# Patient Record
Sex: Male | Born: 1990 | Race: Black or African American | Hispanic: No | Smoking: Current every day smoker
Health system: Southern US, Community
[De-identification: ages and names within clinical notes are randomized; demographics above are authoritative.]

---

## 2009-08-03 ENCOUNTER — Ambulatory Visit: Payer: Self-pay | Admitting: Family Medicine

## 2009-08-03 DIAGNOSIS — G43009 Migraine without aura, not intractable, without status migrainosus: Secondary | ICD-10-CM | POA: Insufficient documentation

## 2009-08-03 DIAGNOSIS — L910 Hypertrophic scar: Secondary | ICD-10-CM | POA: Insufficient documentation

## 2009-10-03 ENCOUNTER — Ambulatory Visit: Payer: Self-pay | Admitting: Family Medicine

## 2011-12-05 ENCOUNTER — Encounter: Payer: Self-pay | Admitting: *Deleted

## 2011-12-05 ENCOUNTER — Emergency Department (HOSPITAL_BASED_OUTPATIENT_CLINIC_OR_DEPARTMENT_OTHER)
Admission: EM | Admit: 2011-12-05 | Discharge: 2011-12-06 | Disposition: A | Discharge status: Home / Self Care | Payer: Self-pay | Attending: Emergency Medicine | Admitting: Emergency Medicine

## 2011-12-05 DIAGNOSIS — J111 Influenza due to unidentified influenza virus with other respiratory manifestations: Secondary | ICD-10-CM | POA: Insufficient documentation

## 2011-12-05 MED ORDER — ACETAMINOPHEN 500 MG PO TABS
1000.0000 mg | ORAL_TABLET | Freq: Once | ORAL | Status: AC
  Administered 2011-12-05: 1000 mg via ORAL
  Filled 2011-12-05: qty 2

## 2011-12-05 NOTE — ED Notes (Signed)
Pt c/o flu like symptoms  x 2 days 

## 2011-12-06 MED ORDER — ALBUTEROL SULFATE HFA 108 (90 BASE) MCG/ACT IN AERS
1.0000 | INHALATION_SPRAY | RESPIRATORY_TRACT | Status: DC | PRN
  Administered 2011-12-06: 2 via RESPIRATORY_TRACT
  Filled 2011-12-06: qty 6.7

## 2011-12-06 MED ORDER — HYDROCOD POLST-CHLORPHEN POLST 10-8 MG/5ML PO LQCR: 5.0000 mL | Freq: Two times a day (BID) | ORAL | Status: AC | PRN

## 2011-12-06 NOTE — ED Provider Notes (Signed)
History     CSN: [id number] Arrival date & time: 12/05/2011 11:53 PM   First MD Initiated Contact with Patient 12/06/11 0000      Chief Complaint  Patient presents with  . Influenza    (Consider location/radiation/quality/duration/timing/severity/associated sxs/prior treatment) Patient is a 20 y.o. male presenting with URI. The history is provided by the patient.  URI The primary symptoms include fever, headaches, sore throat, cough, wheezing and myalgias. Primary symptoms do not include abdominal pain, nausea, vomiting or rash. The current episode started 2 days ago. This is a new problem. The problem has been gradually worsening.  The fever began yesterday. The fever has been unchanged since its onset. The maximum temperature recorded prior to his arrival was 101 to 101.9 F. The temperature was taken by an oral thermometer.  The cough began 2 days ago. The cough is non-productive.  The onset of the illness is associated with exposure to sick contacts. Symptoms associated with the illness include chills, congestion and rhinorrhea. The illness is not associated with facial pain or sinus pressure.    History reviewed. No pertinent past medical history.  History reviewed. No pertinent past surgical history.  History reviewed. No pertinent family history.  History  Substance Use Topics  . Smoking status: Not on file  . Smokeless tobacco: Not on file  . Alcohol Use: Not on file      Review of Systems  Constitutional: Positive for fever and chills.  HENT: Positive for congestion, sore throat and rhinorrhea. Negative for sinus pressure.   Respiratory: Positive for cough and wheezing.   Gastrointestinal: Negative for nausea, vomiting and abdominal pain.  Musculoskeletal: Positive for myalgias.  Skin: Negative for rash.  Neurological: Positive for headaches.  All other systems reviewed and are negative.    Allergies  Review of patient's allergies indicates no known  allergies.  Home Medications  No current outpatient prescriptions on file.  BP 150/100  Pulse 94  Temp(Src) 98.6 F (37 C) (Oral)  Resp 16  Ht 6\' 3"  (1.905 m)  Wt 205 lb (92.987 kg)  BMI 25.62 kg/m2  SpO2 100%  Physical Exam  Nursing note and vitals reviewed. Constitutional: He is oriented to person, place, and time. He appears well-developed and well-nourished. No distress.  HENT:  Head: Normocephalic and atraumatic.  Right Ear: Tympanic membrane and ear canal normal.  Left Ear: Tympanic membrane and ear canal normal.  Nose: Mucosal edema and rhinorrhea present.  Mouth/Throat: Mucous membranes are normal. Posterior oropharyngeal erythema present. No oropharyngeal exudate or posterior oropharyngeal edema.  Eyes: Conjunctivae and EOM are normal. Pupils are equal, round, and reactive to light.  Neck: Normal range of motion. Neck supple.  Cardiovascular: Normal rate, regular rhythm and intact distal pulses.   No murmur heard. Pulmonary/Chest: Effort normal and breath sounds normal. No respiratory distress. He has no wheezes. He has no rales.  Abdominal: Soft. He exhibits no distension. There is no tenderness. There is no rebound and no guarding.  Musculoskeletal: Normal range of motion. He exhibits no edema and no tenderness.  Lymphadenopathy:    He has no cervical adenopathy.  Neurological: He is alert and oriented to person, place, and time.  Skin: Skin is warm and dry. No rash noted. No erythema.  Psychiatric: He has a normal mood and affect. His behavior is normal.    ED Course  Procedures (including critical care time)  Labs Reviewed - No data to display No results found.   No diagnosis found.  MDM   Pt with symptoms consistent with influenza.  Normal exam here but is febrile.  No signs of breathing difficulty  No signs of strep pharyngitis, otitis or abnormal abdominal findings.   No need for CXR or strep at this time.  Will continue antipyretica and rest and  fluids and return for any further problems.         Gwyneth Sprout, MD 12/06/11 [phone]

## 2012-07-05 ENCOUNTER — Emergency Department: Payer: Self-pay | Admitting: Emergency Medicine

## 2014-08-25 ENCOUNTER — Observation Stay: Payer: Self-pay | Admitting: Surgery

## 2014-08-25 LAB — CBC WITH DIFFERENTIAL/PLATELET
BASOS PCT: 0.7 %
Basophil #: 0.1 10*3/uL (ref 0.0–0.1)
EOS ABS: 0.1 10*3/uL (ref 0.0–0.7)
EOS PCT: 0.4 %
HCT: 39.5 % — AB (ref 40.0–52.0)
HGB: 13.2 g/dL (ref 13.0–18.0)
LYMPHS ABS: 3.3 10*3/uL (ref 1.0–3.6)
LYMPHS PCT: 20.2 %
MCH: 31.1 pg (ref 26.0–34.0)
MCHC: 33.5 g/dL (ref 32.0–36.0)
MCV: 93 fL (ref 80–100)
Monocyte #: 1.4 x10 3/mm — ABNORMAL HIGH (ref 0.2–1.0)
Monocyte %: 8.7 %
NEUTROS ABS: 11.3 10*3/uL — AB (ref 1.4–6.5)
NEUTROS PCT: 70 %
Platelet: 154 10*3/uL (ref 150–440)
RBC: 4.26 10*6/uL — AB (ref 4.40–5.90)
RDW: 13.4 % (ref 11.5–14.5)
WBC: 16.1 10*3/uL — ABNORMAL HIGH (ref 3.8–10.6)

## 2014-08-25 LAB — DRUG SCREEN, URINE
Amphetamines, Ur Screen: NEGATIVE (ref ?–1000)
BENZODIAZEPINE, UR SCRN: NEGATIVE (ref ?–200)
Barbiturates, Ur Screen: NEGATIVE (ref ?–200)
COCAINE METABOLITE, UR ~~LOC~~: NEGATIVE (ref ?–300)
Cannabinoid 50 Ng, Ur ~~LOC~~: POSITIVE (ref ?–50)
MDMA (Ecstasy)Ur Screen: NEGATIVE (ref ?–500)
Methadone, Ur Screen: NEGATIVE (ref ?–300)
Opiate, Ur Screen: POSITIVE (ref ?–300)
Phencyclidine (PCP) Ur S: NEGATIVE (ref ?–25)
Tricyclic, Ur Screen: NEGATIVE (ref ?–1000)

## 2014-08-25 LAB — HEMOGLOBIN: HGB: 15.7 g/dL (ref 13.0–18.0)

## 2014-08-26 LAB — CBC WITH DIFFERENTIAL/PLATELET
BASOS PCT: 0.2 %
Basophil #: 0 10*3/uL (ref 0.0–0.1)
EOS ABS: 0.2 10*3/uL (ref 0.0–0.7)
Eosinophil %: 1.3 %
HCT: 36.7 % — ABNORMAL LOW (ref 40.0–52.0)
HGB: 12 g/dL — ABNORMAL LOW (ref 13.0–18.0)
LYMPHS ABS: 3.3 10*3/uL (ref 1.0–3.6)
LYMPHS PCT: 21.6 %
MCH: 30.8 pg (ref 26.0–34.0)
MCHC: 32.8 g/dL (ref 32.0–36.0)
MCV: 94 fL (ref 80–100)
MONOS PCT: 8.5 %
Monocyte #: 1.3 x10 3/mm — ABNORMAL HIGH (ref 0.2–1.0)
Neutrophil #: 10.4 10*3/uL — ABNORMAL HIGH (ref 1.4–6.5)
Neutrophil %: 68.4 %
PLATELETS: 137 10*3/uL — AB (ref 150–440)
RBC: 3.91 10*6/uL — AB (ref 4.40–5.90)
RDW: 13.4 % (ref 11.5–14.5)
WBC: 15.2 10*3/uL — AB (ref 3.8–10.6)

## 2014-08-26 LAB — BASIC METABOLIC PANEL
ANION GAP: 6 — AB (ref 7–16)
BUN: 7 mg/dL (ref 7–18)
CALCIUM: 8.7 mg/dL (ref 8.5–10.1)
CHLORIDE: 106 mmol/L (ref 98–107)
CO2: 26 mmol/L (ref 21–32)
CREATININE: 1.1 mg/dL (ref 0.60–1.30)
EGFR (Non-African Amer.): >60
GLUCOSE: 105 mg/dL — AB (ref 65–99)
Osmolality: 274 (ref 275–301)
POTASSIUM: 3.9 mmol/L (ref 3.5–5.1)
Sodium: 138 mmol/L (ref 136–145)
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
## 2015-04-09 NOTE — Consult Note (Signed)
Brief Consult Note: Diagnosis: alcohol abuse moderate.   Patient was seen by consultant.   Consult note dictated.   Recommend further assessment or treatment.   Orders entered.   Comments: Psychiatry: PAtient seen. Chart reviewed. Note dictated. Patient has a problem with alcohol abuse. He knows it and has a desire to change but no experience with treatment. Does not require inpt treatment. Full eval done and counceling, supportive, educational and motivational interviewing done. REquest SW give him information on outpt treatment in the community and AA. No need for detox orders. Will follow if he is still here tomorrow. Thanks.  Electronic Signatures: Angelli Baruch, Jackquline DenmarkJohn T (MD)  (Signed 10-Sep-15 10:35)  Authored: Brief Consult Note   Last Updated: 10-Sep-15 10:35 by Audery Amellapacs, Sedalia Greeson T (MD)
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
## 2018-01-14 ENCOUNTER — Emergency Department
Admission: EM | Admit: 2018-01-14 | Discharge: 2018-01-14 | Disposition: A | Discharge status: Home / Self Care | Payer: Self-pay | Attending: Emergency Medicine | Admitting: Emergency Medicine

## 2018-01-14 ENCOUNTER — Emergency Department: Payer: Self-pay

## 2018-01-14 ENCOUNTER — Encounter: Payer: Self-pay | Admitting: Emergency Medicine

## 2018-01-14 ENCOUNTER — Other Ambulatory Visit: Payer: Self-pay

## 2018-01-14 DIAGNOSIS — R69 Illness, unspecified: Secondary | ICD-10-CM

## 2018-01-14 DIAGNOSIS — J111 Influenza due to unidentified influenza virus with other respiratory manifestations: Secondary | ICD-10-CM | POA: Insufficient documentation

## 2018-01-14 LAB — INFLUENZA PANEL BY PCR (TYPE A & B)
Influenza A By PCR: POSITIVE — AB
Influenza B By PCR: NEGATIVE

## 2018-01-14 MED ORDER — OSELTAMIVIR PHOSPHATE 75 MG PO CAPS: 75.0000 mg | ORAL_CAPSULE | Freq: Two times a day (BID) | ORAL | 0 refills | Status: AC

## 2018-01-14 NOTE — ED Notes (Addendum)
See triage note  Presents with cough,body aches and low grade fever which started 1-2 days ago  Also had some vomiting yesterday   Afebrile on arrival   States he initially began to feel bad about 1 week ago but became worse on sunday

## 2018-01-14 NOTE — ED Provider Notes (Signed)
[facility] Emergency Department Provider Note  ____________________________________________  Time seen: Approximately 8:53 PM  I have reviewed the triage vital signs and the nursing notes.   HISTORY  Chief Complaint Cough and Nasal Congestion    HPI Jose Poole is a 27 y.o. male patient presents to the emergency department with rhinorrhea, congestion, body aches and fever that became noticeable 2 days ago.  Patient has also had emesis.  He is tolerating fluids now with no major changes in stooling or urinary habits.  Patient son has similar symptoms.  No alleviating measures have been attempted.   History reviewed. No pertinent past medical history.  Patient Active Problem List   Diagnosis Date Noted  . COMMON MIGRAINE 08/03/2009  . 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 No pertinent surgical history.  Prior to Admission medications   Medication Sig Start Date End Date Taking? Authorizing Provider  chlorpheniramine-HYDROcodone (TUSSIONEX PENNKINETIC ER) 10-8 MG/5ML LQCR Take 5 mLs by mouth every 12 (twelve) hours as needed (for cough). 12/06/11   Gwyneth SproutPlunkett, Whitney, MD  oseltamivir (TAMIFLU) 75 MG capsule Take 1 capsule (75 mg total) by mouth 2 (two) times daily for 5 days. 01/14/18 01/19/18  Orvil FeilWoods, Ashle Stief M, PA-C    Allergies Patient has no known allergies.  No family history on file.  Social History Social History   Tobacco Use  . Smoking status: Never Smoker  . Smokeless tobacco: Never Used  Substance Use Topics  . Alcohol use: Not on file  . Drug use: Not on file      Review of Systems  Constitutional: Patient has fever.  Eyes: No visual changes. No discharge ENT: Patient has congestion.  Cardiovascular: no chest pain. Respiratory: Patient has cough.  Gastrointestinal: No abdominal pain.  No nausea, no vomiting. Patient had diarrhea.  Genitourinary: Negative for dysuria. No hematuria Musculoskeletal: Patient has myalgias.   Skin: Negative for rash, abrasions, lacerations, ecchymosis. Neurological: Patient has headache, no focal weakness or numbness.     ____________________________________________   PHYSICAL EXAM:  VITAL SIGNS: ED Triage Vitals  Enc Vitals Group     BP 01/14/18 1804 (!) 149/94     Pulse Rate 01/14/18 1804 73     Resp 01/14/18 1804 20     Temp 01/14/18 1804 98.5 F (36.9 C)     Temp Source 01/14/18 1804 Oral     SpO2 01/14/18 1804 94 %     Weight 01/14/18 1751 220 lb (99.8 kg)     Height 01/14/18 1751 6\' 1"  (1.854 m)     Head Circumference --      Peak Flow --      Pain Score 01/14/18 1751 0     Pain Loc --      Pain Edu? --      Excl. in GC? --      Constitutional: Alert and oriented. Patient is lying supine. Eyes: Conjunctivae are normal. PERRL. EOMI. Head: Atraumatic. ENT:      Ears: Tympanic membranes are mildly injected with mild effusion bilaterally.       Nose: No congestion/rhinnorhea.      Mouth/Throat: Mucous membranes are moist. Posterior pharynx is mildly erythematous.  Hematological/Lymphatic/Immunilogical: No cervical lymphadenopathy.  Cardiovascular: Normal rate, regular rhythm. Normal S1 and S2.  Good peripheral circulation. Respiratory: Normal respiratory effort without tachypnea or retractions. Lungs CTAB. Good air entry to the bases with no decreased or absent breath sounds. Gastrointestinal: Bowel sounds 4 quadrants. Soft and nontender to palpation. No guarding  or rigidity. No palpable masses. No distention. No CVA tenderness. Musculoskeletal: Full range of motion to all extremities. No gross deformities appreciated. Neurologic:  Normal speech and language. No gross focal neurologic deficits are appreciated.  Skin:  Skin is warm, dry and intact. No rash noted. Psychiatric: Mood and affect are normal. Speech and behavior are normal. Patient exhibits appropriate insight and judgement.   ____________________________________________   LABS (all labs  ordered are listed, but only abnormal results are displayed)  Labs Reviewed  INFLUENZA PANEL BY PCR (TYPE A & B) - Abnormal; Notable for the following components:      Result Value   Influenza A By PCR POSITIVE (*)    All other components within normal limits   ____________________________________________  EKG   ____________________________________________  RADIOLOGY Geraldo Pitter, personally viewed and evaluated these images (plain radiographs) as part of my medical decision making, as well as reviewing the written report by the radiologist.  Dg Chest 2 View  Result Date: 01/14/2018 CLINICAL DATA:  Presents with cough,body aches and low grade fever which started 1-2 days ago Also had some vomiting yesterday Afebrile on arrival States he initially began to feel bad about 1 week ago but became worse on Sunday. No hx of heart or lung issues. EXAM: CHEST  2 VIEW COMPARISON:  None. FINDINGS: Cardiomediastinal silhouette is normal in size and configuration. Lungs are clear. Lung volumes are normal. No evidence of pneumonia. No pleural effusion. No pneumothorax seen. Osseous and soft tissue structures about the chest are unremarkable. IMPRESSION: No active cardiopulmonary disease. No evidence of pneumonia or pulmonary edema. Electronically Signed   By: Stan  Maynard M.D.   On: 01/14/2018 19:06    ____________________________________________    PROCEDURES  Procedure(s) performed:    Procedures    Medications - No data to display   ____________________________________________   INITIAL IMPRESSION / ASSESSMENT AND PLAN / ED COURSE  Pertinent labs & imaging results that were available during my care of the patient were reviewed by me and considered in my medical decision making (see chart for details).  Review of the [place] CSRS was performed in accordance of the [facility] prior to dispensing any controlled drugs.     Assessment and plan Influenza A Patient presents to the  emergency department with headache, body aches, fever, congestion, nonproductive cough and emesis.  Differential diagnosis included influenza versus unspecified viral URI.  Patient tested positive for influenza A in the emergency department.  He was discharged with Tamiflu and advised to follow-up with primary care as needed.  All patient questions were answered.     ____________________________________________  FINAL CLINICAL IMPRESSION(S) / ED DIAGNOSES  Final diagnoses:  Influenza-like illness      NEW MEDICATIONS STARTED DURING THIS VISIT:  ED Discharge Orders        Ordered    oseltamivir (TAMIFLU) 75 MG capsule  2 times daily     01 /29/19 1941          This chart was dictated using voice recognition software/Dragon. Despite best efforts to proofread, errors can occur which can change the meaning. Any change was purely unintentional.    Orvil Feil, PA-C 01/14/18 2054    Dionne Bucy, MD 01/14/18 2121

## 2018-01-14 NOTE — ED Triage Notes (Signed)
Cough and nasal congestion x 5 days.  Symptoms worsening on Sunday.

## 2019-10-30 ENCOUNTER — Other Ambulatory Visit: Payer: Self-pay

## 2019-10-30 ENCOUNTER — Emergency Department: Payer: Self-pay

## 2019-10-30 ENCOUNTER — Emergency Department
Admission: EM | Admit: 2019-10-30 | Discharge: 2019-10-31 | Disposition: A | Discharge status: Home / Self Care | Payer: Self-pay | Attending: Emergency Medicine | Admitting: Emergency Medicine

## 2019-10-30 DIAGNOSIS — F17200 Nicotine dependence, unspecified, uncomplicated: Secondary | ICD-10-CM | POA: Insufficient documentation

## 2019-10-30 DIAGNOSIS — Z20828 Contact with and (suspected) exposure to other viral communicable diseases: Secondary | ICD-10-CM | POA: Insufficient documentation

## 2019-10-30 DIAGNOSIS — R112 Nausea with vomiting, unspecified: Secondary | ICD-10-CM | POA: Insufficient documentation

## 2019-10-30 MED ORDER — ONDANSETRON 4 MG PO TBDP
4.0000 mg | ORAL_TABLET | Freq: Once | ORAL | Status: AC
  Administered 2019-10-30: 4 mg via ORAL
  Filled 2019-10-30: qty 1

## 2019-10-30 MED ORDER — ONDANSETRON 4 MG PO TBDP: 4.0000 mg | ORAL_TABLET | Freq: Three times a day (TID) | ORAL | 0 refills | Status: AC | PRN

## 2019-10-30 MED ORDER — METHYLPREDNISOLONE SODIUM SUCC 125 MG IJ SOLR
125.0000 mg | Freq: Once | INTRAMUSCULAR | Status: AC
  Administered 2019-10-30: 125 mg via INTRAMUSCULAR
  Filled 2019-10-30: qty 2

## 2019-10-30 NOTE — Discharge Instructions (Signed)
Please keep yourself hydrated with ice or frequent sips of water.  Take the nausea medication if needed.  Return to the ER for symptoms that change or worsen if unable to schedule an appointment.

## 2019-10-30 NOTE — ED Provider Notes (Signed)
[facility] Emergency Department Provider Note ____________________________________________   First MD Initiated Contact with Patient 10/30/19 2246     (approximate)  I have reviewed the triage vital signs and the nursing notes.   HISTORY  Chief Complaint Emesis  HPI Jose Poole is a 28 y.o. male presents to the emergency department for treatment and evaluation of nausea, vomiting, shortness of breath, and left leg tingling. All symptoms started this evening. No alleviating measures prior to arrival.  History reviewed. No pertinent past medical history.  Patient Active Problem List   Diagnosis Date Noted  . COMMON MIGRAINE 08/03/2009  . 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 No pertinent surgical history.  Prior to Admission medications   Medication Sig Start Date End Date Taking? Authorizing Provider  chlorpheniramine-HYDROcodone (TUSSIONEX PENNKINETIC ER) 10-8 MG/5ML LQCR Take 5 mLs by mouth every 12 (twelve) hours as needed (for cough). 12/06/11   Gwyneth Sprout, MD  ondansetron (ZOFRAN-ODT) 4 MG disintegrating tablet Take 1 tablet (4 mg total) by mouth every 8 (eight) hours as needed for nausea or vomiting. 10/30/19   Chinita Pester, FNP    Allergies Patient has no known allergies.  History reviewed. No pertinent family history.  Social History Social History   Tobacco Use  . Smoking status: Current Every Day Smoker  . Smokeless tobacco: Never Used  Substance Use Topics  . Alcohol use: Not on file  . Drug use: Not on file    Review of Systems  Constitutional: No fever/chills Eyes: No visual changes. ENT: No sore throat. Cardiovascular: Denies chest pain. Respiratory: Positive for shortness of breath. Gastrointestinal: No abdominal pain.  Positive for nausea and vomiting.  No diarrhea.  No constipation. Genitourinary: Negative for dysuria. Musculoskeletal: Negative for back pain. Skin: Negative for rash. Neurological:  Negative for headaches. Positive for tingling in the left calf. ____________________________________________   PHYSICAL EXAM:  VITAL SIGNS: ED Triage Vitals  Enc Vitals Group     BP 10/30/19 2226 (!) 164/88     Pulse Rate 10/30/19 2226 88     Resp 10/30/19 2226 14     Temp 10/30/19 2226 99.6 F (37.6 C)     Temp Source 10/30/19 2226 Oral     SpO2 10/30/19 2226 98 %     Weight 10/30/19 2227 238 lb (108 kg)     Height 10/30/19 2227 6\' 3"  (1.905 m)     Head Circumference --      Peak Flow --      Pain Score 10/30/19 2227 5     Pain Loc --      Pain Edu? --      Excl. in GC? --     Constitutional: Alert and oriented. Well appearing and in no acute distress. Eyes: Conjunctivae are normal. PERRL. EOMI. Head: Atraumatic. Nose: No congestion/rhinnorhea. Mouth/Throat: Mucous membranes are moist.  Oropharynx non-erythematous. Neck: No stridor.   Hematological/Lymphatic/Immunilogical: No cervical lymphadenopathy. Cardiovascular: Normal rate, regular rhythm. Grossly normal heart sounds.  Good peripheral circulation. Respiratory: Normal respiratory effort.  No retractions. Lungs CTAB. Gastrointestinal: Soft and nontender. No distention. No abdominal bruits. No CVA tenderness. Genitourinary:  Musculoskeletal: No lower extremity tenderness nor edema.  No joint effusions. Neurologic:  Normal speech and language. No gross focal neurologic deficits are appreciated. No gait instability. Skin:  Skin is warm, dry and intact. No rash noted. Psychiatric: Mood and affect are normal. Speech and behavior are normal.  ____________________________________________   LABS (all labs ordered are listed,  but only abnormal results are displayed)  Labs Reviewed  SARS CORONAVIRUS 2 (TAT 6-24 HRS)   ____________________________________________  EKG  Not indicated ____________________________________________  RADIOLOGY  ED MD interpretation:    Chest x-ray negative for acute findings per  radiology.  Official radiology report(s): Dg Chest Portable 1 View  Result Date: 10/30/2019 CLINICAL DATA:  Shortness of breath EXAM: PORTABLE CHEST 1 VIEW COMPARISON:  01/14/2018 FINDINGS: Heart and mediastinal contours are within normal limits. No focal opacities or effusions. No acute bony abnormality. IMPRESSION: No active disease. Electronically Signed   By: Rolm Baptise M.D.   On: 10/30/2019 23:10    ____________________________________________   PROCEDURES  Procedure(s) performed (including Critical Care):  Procedures  ____________________________________________   INITIAL IMPRESSION / ASSESSMENT AND PLAN     28 year old male presenting for treatment of nausea and vomiting with subsequent shortness of breath that lasted a couple of hours. No vomiting for the past few hours. No diarrhea. Tingling feeling in the left outer calf without abnormal exam findings.   DIFFERENTIAL DIAGNOSIS  COVID-19, gastroenteritis  ED COURSE  While here, patient given zofran and solu medrol with significant improvement of symptoms. He will be discharged home to await COVID-19 screening. He is to follow up with PCP or return to the ER for symptoms of concern.  [name] was evaluated in Emergency Department on 10/31/2019 for the symptoms described in the history of present illness. He was evaluated in the context of the global COVID-19 pandemic, which necessitated consideration that the patient might be at risk for infection with the SARS-CoV-2 virus that causes COVID-19. Institutional protocols and algorithms that pertain to the evaluation of patients at risk for COVID-19 are in a state of rapid change based on information released by regulatory bodies including the CDC and federal and state organizations. These policies and algorithms were followed during the patient's care in the ED.  ____________________________________________   FINAL CLINICAL IMPRESSION(S) / ED DIAGNOSES  Final  diagnoses:  Intractable vomiting with nausea, unspecified vomiting type     ED Discharge Orders         Ordered    ondansetron (ZOFRAN-ODT) 4 MG disintegrating tablet  Every 8 hours PRN     10/30/19 2353           Note:  This document was prepared using Dragon voice recognition software and may include unintentional dictation errors.   Victorino Dike, FNP 10/31/19 [place]    Duffy Bruce, MD 10/31/19 1558

## 2019-10-30 NOTE — ED Triage Notes (Signed)
Pt presents via POV c/o emesis starting today while at work. Reports left leg pain/numbness. Ambulatory to triage.

## 2019-10-31 LAB — SARS CORONAVIRUS 2 (TAT 6-24 HRS): SARS Coronavirus 2: NEGATIVE

## 2020-05-09 IMAGING — DX DG CHEST 1V PORT
1 series · 1 of 1 positions shown · non-contrast
Comparison: 01/14/2018

CLINICAL DATA: Shortness of breath

EXAM:
PORTABLE CHEST 1 VIEW

[chest ap]
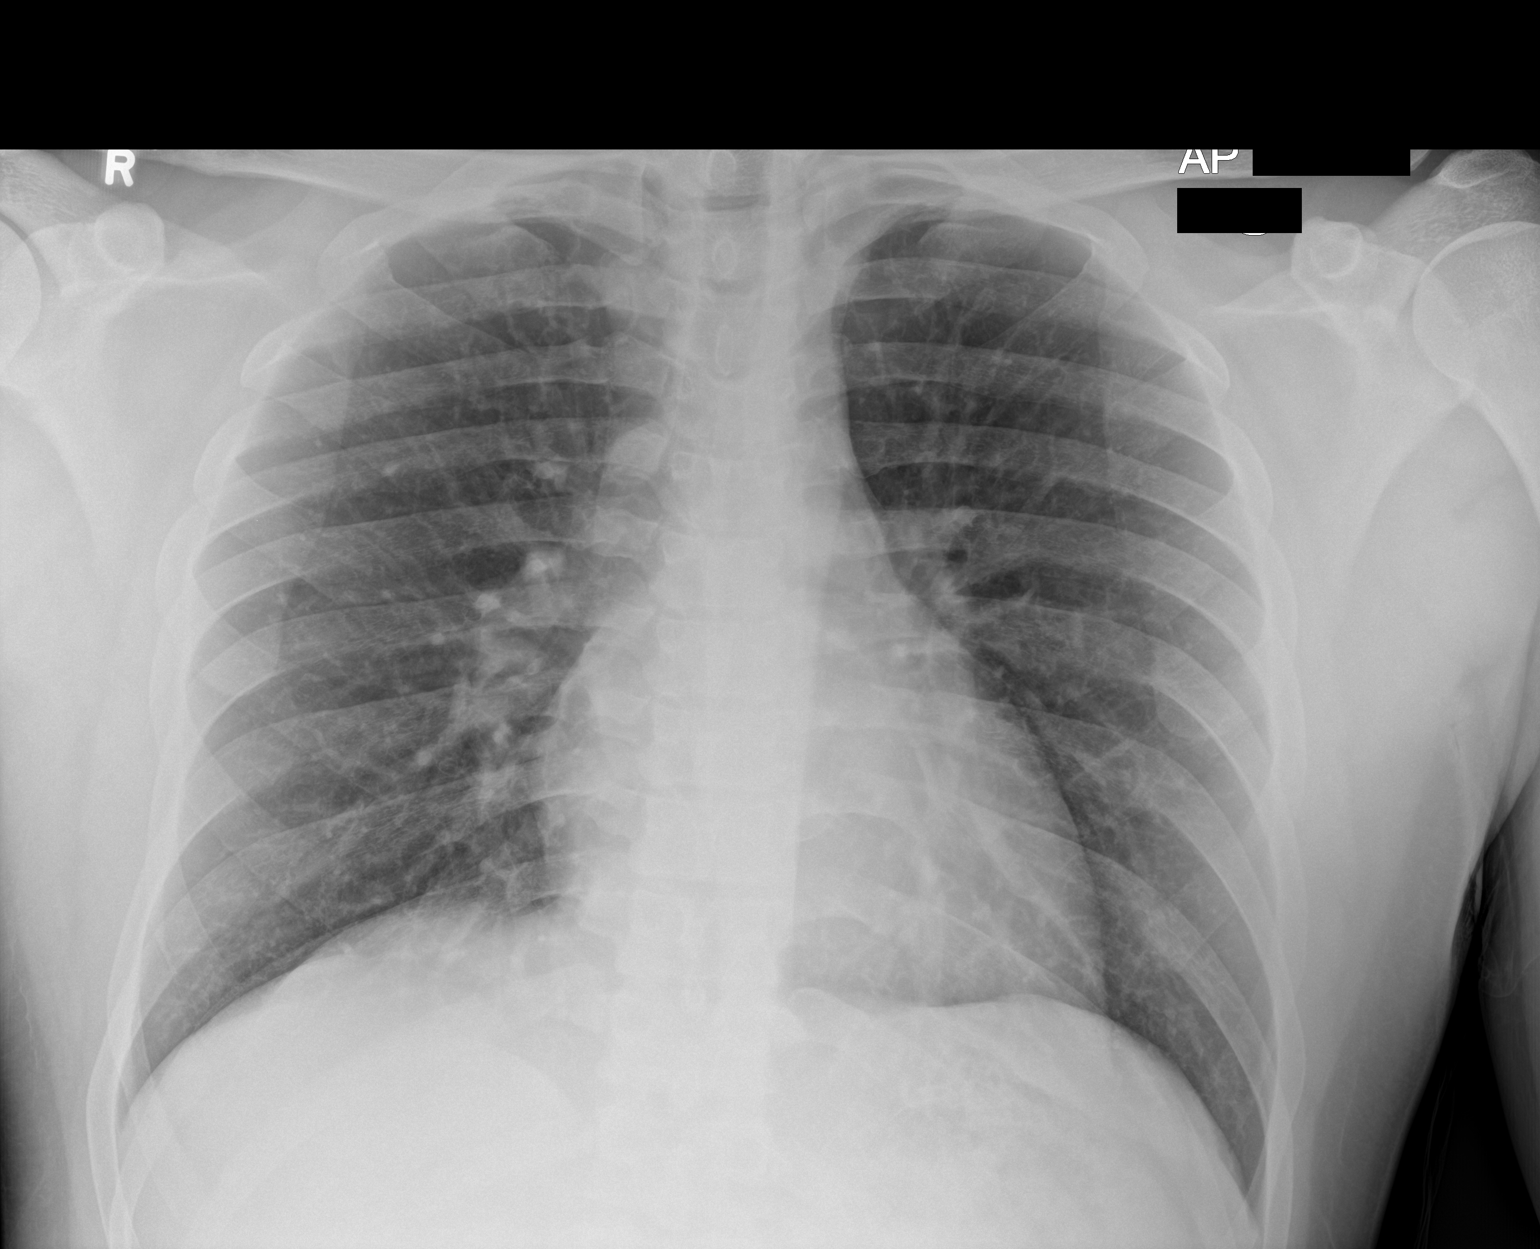

[1 of 1 positions shown; findings below may reference images not displayed]

FINDINGS: Heart and mediastinal contours are within normal limits. No focal
opacities or effusions. No acute bony abnormality.
IMPRESSION: No active disease.

## 2024-05-04 ENCOUNTER — Other Ambulatory Visit: Payer: Self-pay

## 2024-05-04 ENCOUNTER — Emergency Department
Admission: EM | Admit: 2024-05-04 | Discharge: 2024-05-04 | Disposition: A | Discharge status: Home / Self Care | Payer: MEDICAID | Attending: Emergency Medicine | Admitting: Emergency Medicine

## 2024-05-04 DIAGNOSIS — Y908 Blood alcohol level of 240 mg/100 ml or more: Secondary | ICD-10-CM | POA: Insufficient documentation

## 2024-05-04 DIAGNOSIS — F10120 Alcohol abuse with intoxication, uncomplicated: Secondary | ICD-10-CM | POA: Diagnosis present

## 2024-05-04 DIAGNOSIS — F10920 Alcohol use, unspecified with intoxication, uncomplicated: Secondary | ICD-10-CM

## 2024-05-04 LAB — CBC
HCT: 51.3 % (ref 39.0–52.0)
Hemoglobin: 17.8 g/dL — ABNORMAL HIGH (ref 13.0–17.0)
MCH: 32.4 pg (ref 26.0–34.0)
MCHC: 34.7 g/dL (ref 30.0–36.0)
MCV: 93.4 fL (ref 80.0–100.0)
Platelets: 246 10*3/uL (ref 150–400)
RBC: 5.49 MIL/uL (ref 4.22–5.81)
RDW: 12.8 % (ref 11.5–15.5)
WBC: 8.7 10*3/uL (ref 4.0–10.5)
nRBC: 0 % (ref 0.0–0.2)

## 2024-05-04 LAB — COMPREHENSIVE METABOLIC PANEL WITH GFR
ALT: 22 U/L (ref 0–44)
AST: 23 U/L (ref 15–41)
Albumin: 4.4 g/dL (ref 3.5–5.0)
Alkaline Phosphatase: 56 U/L (ref 38–126)
Anion gap: 10 (ref 5–15)
BUN: <5 mg/dL — ABNORMAL LOW (ref 6–20)
CO2: 25 mmol/L (ref 22–32)
Calcium: 9 mg/dL (ref 8.9–10.3)
Chloride: 104 mmol/L (ref 98–111)
Creatinine, Ser: 0.97 mg/dL (ref 0.61–1.24)
GFR, Estimated: >60 mL/min (ref >60)
Glucose, Bld: 100 mg/dL — ABNORMAL HIGH (ref 70–99)
Potassium: 3.7 mmol/L (ref 3.5–5.1)
Sodium: 139 mmol/L (ref 135–145)
Total Bilirubin: 0.5 mg/dL (ref 0.0–1.2)
Total Protein: 7.6 g/dL (ref 6.5–8.1)

## 2024-05-04 LAB — ETHANOL: Alcohol, Ethyl (B): 251 mg/dL — ABNORMAL HIGH (ref <15)

## 2024-05-04 LAB — TROPONIN I (HIGH SENSITIVITY): Troponin I (High Sensitivity): 4 ng/L (ref <18)

## 2024-05-04 LAB — ACETAMINOPHEN LEVEL: Acetaminophen (Tylenol), Serum: <10 ug/mL — ABNORMAL LOW (ref 10–30)

## 2024-05-04 MED ORDER — ONDANSETRON 4 MG PO TBDP
4.0000 mg | ORAL_TABLET | Freq: Once | ORAL | Status: AC
  Administered 2024-05-04: 4 mg via ORAL
  Filled 2024-05-04: qty 1

## 2024-05-04 NOTE — ED Provider Notes (Signed)
 [facility] Provider Note    Event Date/Time   First MD Initiated Contact with Patient 05/04/24 0216     (approximate)   History   Alcohol Intoxication   HPI  Jose Poole is a 33 year old male presenting to the emergency department for evaluation of alcohol intoxication.  Patient was sleeping behind his car and a bystander called PD.  Police took his keys as patient seemed too drunk to drive and EMS was called bring the patient to the hospital.  Patient reports that he has been drinking heavily tonight and used cocaine about 6 hours ago.  Denies head trauma, chest pain, shortness of breath, or any other new or concerning symptoms.     Physical Exam   Triage Vital Signs: ED Triage Vitals  Encounter Vitals Group     BP 05/04/24 0220 115/80     Systolic BP Percentile --      Diastolic BP Percentile --      Pulse Rate 05/04/24 0220 82     Resp 05/04/24 0220 17     Temp 05/04/24 0220 97.9 F (36.6 C)     Temp Source 05/04/24 0220 Oral     SpO2 05/04/24 0220 99 %     Weight 05/04/24 0221 230 lb (104.3 kg)     Height 05/04/24 0221 6\' 3"  (1.905 m)     Head Circumference --      Peak Flow --      Pain Score 05/04/24 0221 0     Pain Loc --      Pain Education --      Exclude from Growth Chart --     Most recent vital signs: Vitals:   05/04/24 0330 05/04/24 0400  BP: 106/67 113/68  Pulse: 90 84  Resp:    Temp:    SpO2: 92% 92%     General: Somnolent, briefly arouses to answer questions CV:  Regular rate, good peripheral perfusion.  Resp:  Unlabored respirations.  Abd:  Nondistended.  Neuro:  No gross facial asymmetry, moving extremity spontaneously, minimal vocalization but answering yes or no questions   ED Results / Procedures / Treatments   Labs (all labs ordered are listed, but only abnormal results are displayed) Labs Reviewed  CBC - Abnormal; Notable for the following components:      Result Value   Hemoglobin 17.8 (*)    All  other components within normal limits  COMPREHENSIVE METABOLIC PANEL WITH GFR - Abnormal; Notable for the following components:   Glucose, Bld 100 (*)    BUN <5 (*)    All other components within normal limits  ACETAMINOPHEN  LEVEL - Abnormal; Notable for the following components:   Acetaminophen  (Tylenol ), Serum <10 (*)    All other components within normal limits  ETHANOL - Abnormal; Notable for the following components:   Alcohol, Ethyl (B) 251 (*)    All other components within normal limits  TROPONIN I (HIGH SENSITIVITY)     EKG EKG independently reviewed interpreted by myself (ER attending) demonstrates:  EKG demonstrates sinus bradycardia at a rate of 58, PR 186, QRS 98, QTc 365, no acute ST changes  RADIOLOGY Imaging independently reviewed and interpreted by myself demonstrates:   Formal Radiology Read:  No results found.  PROCEDURES:  Critical Care performed: No  Procedures   MEDICATIONS ORDERED IN ED: Medications  ondansetron  (ZOFRAN -ODT) disintegrating tablet 4 mg (has no administration in time range)     IMPRESSION / MDM / ASSESSMENT AND  PLAN / ED COURSE  I reviewed the triage vital signs and the nursing notes.  Differential diagnosis includes, but is not limited to, acute alcohol intoxication, cocaine intoxication or withdrawal, no reported head trauma or evidence of trauma on exam, no reported chest pain  Patient's presentation is most consistent with acute complicated illness / injury requiring diagnostic workup.  33 year old male presenting with altered mental status in the setting of alcohol intoxication.  Stable vitals on presentation.  Labs sent from triage overall reassuring.  Negative troponin, reassuring EKG, greater than 3 hours since cocaine use and no reported chest pain.  EtOH elevated at 251, consistent with patient's history of alcohol intoxication.  Will allow patient time to metabolize.  If mental status improves, suspect he will be stable for  discharge home. Clinical Course as of 05/04/24 0616  Mon May 04, 2024  0614 Patient reassessed.  Now readily arousable.  Speaking in sentences without slurred speech.  Ambulated with stable gait to the bathroom.  Reports mild nausea, ordered for Zofran .  Appears clinically sober at this time, do think he is stable for discharge.  Strict return precautions provided. [NR]    Clinical Course User Index [NR] Claria Crofts, MD     FINAL CLINICAL IMPRESSION(S) / ED DIAGNOSES   Final diagnoses:  Acute alcoholic intoxication without complication (HCC)     Rx / DC Orders   ED Discharge Orders     None        Note:  This document was prepared using Dragon voice recognition software and may include unintentional dictation errors.   Claria Crofts, MD 05/04/24 [id number]

## 2024-05-04 NOTE — ED Notes (Signed)
 Verified correct patient and correct discharge papers given. Pt alert and oriented X 4, stable for discharge. RR even and unlabored, color WNL. Discussed discharge instructions and follow-up as directed. Discharge medications discussed, when prescribed. Pt had opportunity to ask questions, and RN available to provide patient and/or family education. Received all belongings before discharge.

## 2024-05-04 NOTE — Discharge Instructions (Signed)
Return to the ER for new or worsening symptoms. °

## 2024-05-04 NOTE — ED Notes (Addendum)
 Pt ambulated to and from bathroom, no assistance required. Pt alert and oriented X4, cooperative, RR even and unlabored, color WNL. Pt in NAD.   Pt called for ride home.

## 2024-05-04 NOTE — ED Triage Notes (Addendum)
 BIB [facility] with CC of alcohol intoxication. Bystander saw pt sleeping behind car and called PD. Per EMS PD took pts car keys as he was too inebriated to drive and then called EMS to bring pt to hospital. Pt reports drinking heavily and using cocaine approx 6 hours ago.
